# Patient Record
Sex: Female | Born: 1950 | Race: White
Health system: Northeastern US, Academic
[De-identification: ages and names within clinical notes are randomized; demographics above are authoritative.]

## PROBLEM LIST (undated history)

## (undated) DIAGNOSIS — M1990 Unspecified osteoarthritis, unspecified site: Secondary | ICD-10-CM

---

## 2020-10-31 ENCOUNTER — Emergency Department
Admission: EM | Admit: 2020-10-31 | Discharge: 2020-10-31 | Disposition: A | Discharge status: Home / Self Care | Payer: PRIVATE HEALTH INSURANCE | Source: Ambulatory Visit | Attending: Emergency Medicine | Admitting: Emergency Medicine

## 2020-10-31 ENCOUNTER — Emergency Department: Payer: PRIVATE HEALTH INSURANCE

## 2020-10-31 ENCOUNTER — Emergency Department: Payer: PRIVATE HEALTH INSURANCE | Admitting: Radiology

## 2020-10-31 ENCOUNTER — Other Ambulatory Visit: Payer: Self-pay

## 2020-10-31 DIAGNOSIS — S01511A Laceration without foreign body of lip, initial encounter: Secondary | ICD-10-CM

## 2020-10-31 DIAGNOSIS — Y92008 Other place in unspecified non-institutional (private) residence as the place of occurrence of the external cause: Secondary | ICD-10-CM | POA: Insufficient documentation

## 2020-10-31 DIAGNOSIS — M25562 Pain in left knee: Secondary | ICD-10-CM

## 2020-10-31 DIAGNOSIS — S8002XA Contusion of left knee, initial encounter: Secondary | ICD-10-CM | POA: Insufficient documentation

## 2020-10-31 DIAGNOSIS — S0990XA Unspecified injury of head, initial encounter: Secondary | ICD-10-CM

## 2020-10-31 DIAGNOSIS — M79662 Pain in left lower leg: Secondary | ICD-10-CM

## 2020-10-31 DIAGNOSIS — Y998 Other external cause status: Secondary | ICD-10-CM | POA: Insufficient documentation

## 2020-10-31 DIAGNOSIS — Y9389 Activity, other specified: Secondary | ICD-10-CM | POA: Insufficient documentation

## 2020-10-31 DIAGNOSIS — S0033XA Contusion of nose, initial encounter: Secondary | ICD-10-CM | POA: Insufficient documentation

## 2020-10-31 DIAGNOSIS — S8000XA Contusion of unspecified knee, initial encounter: Secondary | ICD-10-CM

## 2020-10-31 DIAGNOSIS — S0993XA Unspecified injury of face, initial encounter: Secondary | ICD-10-CM

## 2020-10-31 DIAGNOSIS — W1839XA Other fall on same level, initial encounter: Secondary | ICD-10-CM | POA: Insufficient documentation

## 2020-10-31 DIAGNOSIS — W01198A Fall on same level from slipping, tripping and stumbling with subsequent striking against other object, initial encounter: Secondary | ICD-10-CM

## 2020-10-31 DIAGNOSIS — W19XXXA Unspecified fall, initial encounter: Secondary | ICD-10-CM

## 2020-10-31 DIAGNOSIS — S8992XA Unspecified injury of left lower leg, initial encounter: Secondary | ICD-10-CM

## 2020-10-31 DIAGNOSIS — Z7982 Long term (current) use of aspirin: Secondary | ICD-10-CM | POA: Insufficient documentation

## 2020-10-31 DIAGNOSIS — S0083XA Contusion of other part of head, initial encounter: Secondary | ICD-10-CM

## 2020-10-31 HISTORY — DX: Unspecified osteoarthritis, unspecified site: M19.90

## 2020-10-31 LAB — HM HIV SCREENING OFFERED

## 2020-10-31 NOTE — ED Provider Progress Notes (Signed)
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 Diffuse osteopenia. No definite displaced fractures or subluxations. 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 Evaluation for joint effusion is difficult given the obliquity on the lateral view.     END OF IMPRESSION    2041 CT head without contrast  Impression    No acute intracranial pathology.     END OF IMPRESSION    2117 CT maxillofacial without contrast  Impression    No acute nasal bone fracture or other maxillofacial fracture.     END OF IMPRESSION  This result has not been signed   2117 Safe for discharge        Zollie Beckers, MD, 10/31/2020, 9:18 PM     Zollie Beckers, MD  10/31/20 2119

## 2020-10-31 NOTE — ED Notes (Signed)
Assumed pt care. Agree with triage note.    Nursing Care Plan:  Will monitor and assess VS and pain scores every 2-4 hours and prn.  Perform frequent rounding prn.  Provide updates to patient and/or cargiver frequently.  Provide support to patient/caregiver as needed.  Teach patient and/or caregivers about patients needs/status working towards discharge.  Patient oriented to room and given call bell.

## 2020-10-31 NOTE — ED Provider Notes (Signed)
History     Chief Complaint   Patient presents with    Fall    Knee Injury    Facial Injury     Patient is a 70 year old female presenting to the emergency department after a fall she was turning around in her driveway her walker wheel turned and she fell to the ground left knee hit the ground face hit the ground denies any loss of consciousness no vomiting had some epistaxis that is since resolved, she takes the 81 mg aspirin daily for primary prevention.  Denies any chest pain shortness of breath weakness in the arms or legs she does complain of some left knee pain at site of her previous surgery.  Tetanus up-to-date within the last 5 years      History provided by:  Patient        Medical/Surgical/Family History     Past Medical History:   Diagnosis Date    Arthritis         There is no problem list on file for this patient.           Past Surgical History:   Procedure Laterality Date    JOINT REPLACEMENT Left     partial knee replacement    KNEE SURGERY Bilateral      History reviewed. No pertinent family history.          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                 Review of Systems   Review of Systems   Constitutional:        Reviewed and negative unless stated in HPI   HENT:        Reviewed and negative unless stated in HPI   Eyes:        Reviewed and negative unless stated in HPI   Respiratory:        Reviewed and negative unless stated in HPI   Cardiovascular:        Reviewed and negative unless stated in HPI   Gastrointestinal:        Reviewed and negative unless stated in HPI   Genitourinary:        Reviewed and negative unless stated in HPI   Musculoskeletal:        Reviewed and negative unless stated in HPI   Skin:        Reviewed and negative unless stated in HPI   Neurological:        Reviewed and negative unless stated in HPI        Physical Exam     Triage Vitals  Triage Start: Start, (10/31/20 1717)   First Recorded BP: 130/88, Resp: 20, Temp: 36.4 C (97.5 F), Temp src: TEMPORAL Oxygen Therapy SpO2: 99 %, O2 Device: None (Room air), Heart Rate: 77, (10/31/20 1720)  .  First Pain Reported  0-10 Scale: 3, Pain Location/Orientation: (S)  (upper lip), (10/31/20 1720)       Physical Exam  Constitutional:       Appearance: Normal appearance.   HENT:      Head: Normocephalic.      Comments: No midface tenderness dentition intact dentures on the upper maxilla, there is a small laceration on the  mucosal surface of the upper lip nonbleeding, there is bruising and tenderness to the bridge of the nose, no septal hematoma noted, no blood in the posterior pharynx.     Right Ear: External ear normal.      Left Ear: External ear normal.      Mouth/Throat:      Mouth: Mucous membranes are moist.      Pharynx: Oropharynx is clear.   Eyes:      General: No scleral icterus.     Conjunctiva/sclera: Conjunctivae normal.   Neck:      Comments: Cervical spine is nontender to palpation full range of motion  Cardiovascular:      Rate and Rhythm: Normal rate.   Pulmonary:      Effort: Pulmonary effort is normal. No respiratory distress.      Breath sounds: Normal breath sounds.   Abdominal:      General: There is no distension.      Tenderness: There is no abdominal tenderness.   Musculoskeletal:         General: Tenderness present. No swelling or deformity.      Cervical back: Normal range of motion and neck supple. No rigidity.      Comments: No tenderness over the pelvis bilateral femurs, bilateral upper extremities, there is tenderness on the left lateral portion of the tib-fib, range of motion is intact dorsiflexion and plantarflexion intact no tenderness over the feet.   Skin:     Coloration: Skin is not jaundiced or pale.   Neurological:      Mental Status: She is alert.   Psychiatric:         Mood and Affect: Mood normal.         Behavior: Behavior  normal.         Medical Decision Making     ED Course and Disposition:  This 70 year old female with a history of knee replacement presenting to the emergency department after mechanical fall twisted and fell pain with left knee but ambulatory afterward my suspicion for acute fracture is low but given hardware plain film x-rays obtained, CT imaging of the head and face given her facial contusions and anticoagulation with aspirin obtain these were pending and the patient was signed out to my colleague Dr. Mellissa Kohut pending results of imaging studies.              Valeda Malm, MD          Valeda Malm, MD  11/02/20 [phone]

## 2020-10-31 NOTE — Discharge Instructions (Signed)
Your imaging in the emergency department showed no abnormalities.    Review your home care/return instructions and discharge attachment.     Take Tylenol 1000mg every 6 hours and or Motrin/Ibuprofen 600mg every 6 hours as needed/directed for pain.

## 2020-10-31 NOTE — ED Notes (Signed)
Report Given To   Megan RN      Descriptive Sentence / Reason for Admission   Pt states one hour prior to visit, was walking with upright walker, hit a hole and fell onto knees, then face. Epistaxis initially. No bleeding at this time. Nose with swelling and bruising. Pain upper lip. Was wearing dentures. No damage. No LOC. No blood thinners. No neck pain or tenderness. Pain upper lip and bilateral knees.   Hx partial knee replacement left knee. Took and aleve.  Holding ice to face.        Active Issues / Relevant Events   Xray- negative   CT- pending         To Do List          Anticipatory Guidance / Discharge Planning  Pending

## 2020-10-31 NOTE — ED Triage Notes (Signed)
Pt states one hour prior to visit, was walking with upright walker, hit a hole and fell onto knees, then face. Epistaxis initially. No bleeding at this time. Nose with swelling and bruising. Pain upper lip. Was wearing dentures. No damage. No LOC. No blood thinners. No neck pain or tenderness. Pain upper lip and bilateral knees.   Hx partial knee replacement left knee. Took and aleve.  Holding ice to face.
